# Patient Record
Sex: Male | Born: 1995 | Hispanic: No
Health system: Southern US, Community
[De-identification: ages and names within clinical notes are randomized; demographics above are authoritative.]

---
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
## 2017-07-04 DIAGNOSIS — J069 Acute upper respiratory infection, unspecified: Secondary | ICD-10-CM | POA: Diagnosis not present

## 2018-01-29 DIAGNOSIS — S4992XA Unspecified injury of left shoulder and upper arm, initial encounter: Secondary | ICD-10-CM | POA: Diagnosis not present

## 2018-04-05 DIAGNOSIS — J029 Acute pharyngitis, unspecified: Secondary | ICD-10-CM | POA: Diagnosis not present

## 2019-05-20 ENCOUNTER — Ambulatory Visit: Payer: Managed Care, Other (non HMO) | Attending: Internal Medicine

## 2019-05-20 DIAGNOSIS — Z20822 Contact with and (suspected) exposure to covid-19: Secondary | ICD-10-CM

## 2019-05-21 ENCOUNTER — Telehealth: Payer: Self-pay

## 2019-05-21 NOTE — Telephone Encounter (Signed)
Pt. Viewed his result on MyChart.  was sent CDC guidelines for isolation and safe practice guidelines via MyChart.  Pt. Called and spoke with Triage nurse and was given verbal instructions for quarantine, home care, and when to seek ER care.  See Result note of 2:09 PM, 1/26.  [facility] notified.

## 2019-05-21 NOTE — Progress Notes (Signed)
He viewed his COVID-19 test result on MyChart on 05/21/2019 at 1:40 PM.  I attempted to call him however his voicemail box is full so unable to leave a message.    I sent him a detailed MyChart message letting him know his COVID-19 test result was "detected".  I went over the 10 day quarantine protocol.   I went over the criteria for ending quarantine per CDC quide lines.  When to seek medical care, use of OTC medications for symptoms, cleaning of surfaces, wearing masks, social distancing.  I went over the 14 day quarantine protocol for members of his family living in the same home.  Left the phone number for him to call if questions/concerns.     [facility] notified.

## 2019-05-21 NOTE — Telephone Encounter (Signed)
-----   Message from Debby Bud, MD sent at 05/21/2019  1:49 PM EST ----- Regarding: +covid test Please inform pt and Health Dept of +covid test. Thank you.

## 2019-06-12 LAB — NOVEL CORONAVIRUS, NAA: SARS-CoV-2, NAA: DETECTED — AB

## 2019-06-12 LAB — SPECIMEN STATUS REPORT
